# Patient Record
Sex: Female | Born: 1945
Health system: Southern US, Community
[De-identification: ages and names within clinical notes are randomized; demographics above are authoritative.]

## PROBLEM LIST (undated history)

## (undated) HISTORY — PX: BREAST BIOPSY: SHX20

---

## 2000-10-28 ENCOUNTER — Other Ambulatory Visit: Admission: RE | Admit: 2000-10-28 | Discharge: 2000-10-28 | Payer: Self-pay | Admitting: Gynecology

## 2001-12-01 ENCOUNTER — Other Ambulatory Visit: Admission: RE | Admit: 2001-12-01 | Discharge: 2001-12-01 | Payer: Self-pay | Admitting: Gynecology

## 2002-01-22 ENCOUNTER — Encounter: Payer: Self-pay | Admitting: Gynecology

## 2002-01-22 ENCOUNTER — Encounter: Admission: RE | Admit: 2002-01-22 | Discharge: 2002-01-22 | Payer: Self-pay | Admitting: Gynecology

## 2002-10-28 ENCOUNTER — Encounter: Admission: RE | Admit: 2002-10-28 | Discharge: 2002-10-28 | Payer: Self-pay | Admitting: Gynecology

## 2002-10-28 ENCOUNTER — Encounter: Payer: Self-pay | Admitting: Gynecology

## 2002-11-10 ENCOUNTER — Encounter: Admission: RE | Admit: 2002-11-10 | Discharge: 2002-11-10 | Payer: Self-pay | Admitting: Gynecology

## 2002-11-10 ENCOUNTER — Encounter: Payer: Self-pay | Admitting: Gynecology

## 2002-12-07 ENCOUNTER — Other Ambulatory Visit: Admission: RE | Admit: 2002-12-07 | Discharge: 2002-12-07 | Payer: Self-pay | Admitting: Gynecology

## 2003-11-29 ENCOUNTER — Encounter: Admission: RE | Admit: 2003-11-29 | Discharge: 2003-11-29 | Payer: Self-pay | Admitting: Gynecology

## 2003-12-08 ENCOUNTER — Other Ambulatory Visit: Admission: RE | Admit: 2003-12-08 | Discharge: 2003-12-08 | Payer: Self-pay | Admitting: Gynecology

## 2004-11-29 ENCOUNTER — Encounter: Admission: RE | Admit: 2004-11-29 | Discharge: 2004-11-29 | Payer: Self-pay | Admitting: Gynecology

## 2004-12-11 ENCOUNTER — Other Ambulatory Visit: Admission: RE | Admit: 2004-12-11 | Discharge: 2004-12-11 | Payer: Self-pay | Admitting: Gynecology

## 2005-12-03 ENCOUNTER — Encounter: Admission: RE | Admit: 2005-12-03 | Discharge: 2005-12-03 | Payer: Self-pay | Admitting: Gynecology

## 2005-12-13 ENCOUNTER — Other Ambulatory Visit: Admission: RE | Admit: 2005-12-13 | Discharge: 2005-12-13 | Payer: Self-pay | Admitting: Gynecology

## 2006-12-31 ENCOUNTER — Encounter: Admission: RE | Admit: 2006-12-31 | Discharge: 2006-12-31 | Payer: Self-pay | Admitting: Occupational Medicine

## 2006-12-31 ENCOUNTER — Encounter: Admission: RE | Admit: 2006-12-31 | Discharge: 2006-12-31 | Payer: Self-pay | Admitting: Gynecology

## 2007-01-02 ENCOUNTER — Other Ambulatory Visit: Admission: RE | Admit: 2007-01-02 | Discharge: 2007-01-02 | Payer: Self-pay | Admitting: Gynecology

## 2007-01-23 ENCOUNTER — Encounter: Admission: RE | Admit: 2007-01-23 | Discharge: 2007-04-23 | Payer: Self-pay | Admitting: Internal Medicine

## 2007-10-03 ENCOUNTER — Emergency Department (HOSPITAL_COMMUNITY): Admission: EM | Admit: 2007-10-03 | Discharge: 2007-10-03 | Payer: Self-pay | Admitting: Emergency Medicine

## 2008-01-01 ENCOUNTER — Encounter: Admission: RE | Admit: 2008-01-01 | Discharge: 2008-01-01 | Payer: Self-pay | Admitting: Gynecology

## 2009-01-10 ENCOUNTER — Encounter: Admission: RE | Admit: 2009-01-10 | Discharge: 2009-01-10 | Payer: Self-pay | Admitting: Gynecology

## 2010-01-11 ENCOUNTER — Encounter: Admission: RE | Admit: 2010-01-11 | Discharge: 2010-01-11 | Payer: Self-pay | Admitting: Gynecology

## 2010-05-21 ENCOUNTER — Encounter: Payer: Self-pay | Admitting: Gynecology

## 2010-06-26 ENCOUNTER — Inpatient Hospital Stay (INDEPENDENT_AMBULATORY_CARE_PROVIDER_SITE_OTHER)
Admission: RE | Admit: 2010-06-26 | Discharge: 2010-06-26 | Disposition: A | Discharge status: Home / Self Care | Payer: Self-pay | Source: Ambulatory Visit | Attending: Emergency Medicine | Admitting: Emergency Medicine

## 2010-06-26 ENCOUNTER — Ambulatory Visit (INDEPENDENT_AMBULATORY_CARE_PROVIDER_SITE_OTHER): Payer: PRIVATE HEALTH INSURANCE

## 2010-06-26 ENCOUNTER — Ambulatory Visit (HOSPITAL_COMMUNITY): Admission: RE | Admit: 2010-06-26 | Payer: Self-pay | Source: Ambulatory Visit

## 2010-06-26 DIAGNOSIS — S8000XA Contusion of unspecified knee, initial encounter: Secondary | ICD-10-CM

## 2010-12-05 ENCOUNTER — Other Ambulatory Visit: Payer: Self-pay | Admitting: Gynecology

## 2010-12-05 DIAGNOSIS — Z1231 Encounter for screening mammogram for malignant neoplasm of breast: Secondary | ICD-10-CM

## 2011-01-15 ENCOUNTER — Ambulatory Visit: Payer: Self-pay

## 2011-01-15 ENCOUNTER — Ambulatory Visit
Admission: RE | Admit: 2011-01-15 | Discharge: 2011-01-15 | Disposition: A | Discharge status: Home / Self Care | Payer: PRIVATE HEALTH INSURANCE | Source: Ambulatory Visit | Attending: Gynecology | Admitting: Gynecology

## 2011-01-15 DIAGNOSIS — Z1231 Encounter for screening mammogram for malignant neoplasm of breast: Secondary | ICD-10-CM

## 2011-01-19 ENCOUNTER — Other Ambulatory Visit: Payer: Self-pay | Admitting: Gynecology

## 2011-01-19 DIAGNOSIS — R928 Other abnormal and inconclusive findings on diagnostic imaging of breast: Secondary | ICD-10-CM

## 2011-01-31 ENCOUNTER — Other Ambulatory Visit: Payer: Self-pay | Admitting: Gynecology

## 2011-01-31 ENCOUNTER — Ambulatory Visit
Admission: RE | Admit: 2011-01-31 | Discharge: 2011-01-31 | Disposition: A | Discharge status: Home / Self Care | Payer: 59 | Source: Ambulatory Visit | Attending: Gynecology | Admitting: Gynecology

## 2011-01-31 DIAGNOSIS — R928 Other abnormal and inconclusive findings on diagnostic imaging of breast: Secondary | ICD-10-CM

## 2011-12-10 ENCOUNTER — Other Ambulatory Visit: Payer: Self-pay | Admitting: Gynecology

## 2011-12-10 DIAGNOSIS — Z1231 Encounter for screening mammogram for malignant neoplasm of breast: Secondary | ICD-10-CM

## 2012-01-16 ENCOUNTER — Ambulatory Visit
Admission: RE | Admit: 2012-01-16 | Discharge: 2012-01-16 | Disposition: A | Discharge status: Home / Self Care | Payer: 59 | Source: Ambulatory Visit | Attending: Gynecology | Admitting: Gynecology

## 2012-01-16 DIAGNOSIS — Z1231 Encounter for screening mammogram for malignant neoplasm of breast: Secondary | ICD-10-CM

## 2012-01-28 ENCOUNTER — Other Ambulatory Visit: Payer: Self-pay | Admitting: Gynecology

## 2012-12-08 ENCOUNTER — Other Ambulatory Visit: Payer: Self-pay

## 2012-12-08 DIAGNOSIS — Z1231 Encounter for screening mammogram for malignant neoplasm of breast: Secondary | ICD-10-CM

## 2013-01-19 ENCOUNTER — Ambulatory Visit
Admission: RE | Admit: 2013-01-19 | Discharge: 2013-01-19 | Disposition: A | Discharge status: Home / Self Care | Payer: 59 | Source: Ambulatory Visit

## 2013-01-19 DIAGNOSIS — Z1231 Encounter for screening mammogram for malignant neoplasm of breast: Secondary | ICD-10-CM

## 2013-02-06 ENCOUNTER — Other Ambulatory Visit: Payer: Self-pay | Admitting: Gynecology

## 2013-02-06 DIAGNOSIS — M810 Age-related osteoporosis without current pathological fracture: Secondary | ICD-10-CM

## 2013-12-11 ENCOUNTER — Other Ambulatory Visit: Payer: Self-pay

## 2013-12-11 DIAGNOSIS — Z1231 Encounter for screening mammogram for malignant neoplasm of breast: Secondary | ICD-10-CM

## 2014-01-20 ENCOUNTER — Ambulatory Visit
Admission: RE | Admit: 2014-01-20 | Discharge: 2014-01-20 | Disposition: A | Discharge status: Home / Self Care | Payer: 59 | Source: Ambulatory Visit

## 2014-01-20 ENCOUNTER — Encounter (INDEPENDENT_AMBULATORY_CARE_PROVIDER_SITE_OTHER): Payer: Self-pay

## 2014-01-20 DIAGNOSIS — Z1231 Encounter for screening mammogram for malignant neoplasm of breast: Secondary | ICD-10-CM

## 2014-12-28 ENCOUNTER — Other Ambulatory Visit: Payer: Self-pay

## 2014-12-28 DIAGNOSIS — Z1231 Encounter for screening mammogram for malignant neoplasm of breast: Secondary | ICD-10-CM

## 2015-01-24 ENCOUNTER — Ambulatory Visit
Admission: RE | Admit: 2015-01-24 | Discharge: 2015-01-24 | Disposition: A | Discharge status: Home / Self Care | Payer: 59 | Source: Ambulatory Visit

## 2015-01-24 DIAGNOSIS — Z1231 Encounter for screening mammogram for malignant neoplasm of breast: Secondary | ICD-10-CM

## 2015-06-10 DIAGNOSIS — D649 Anemia, unspecified: Secondary | ICD-10-CM | POA: Diagnosis not present

## 2015-07-08 DIAGNOSIS — M79672 Pain in left foot: Secondary | ICD-10-CM | POA: Diagnosis not present

## 2015-08-26 MED FILL — ESTRADIOL TDS 0.0375 MG/DAY: 0.0375 | 84 days supply | Qty: 12 | Fill #1

## 2015-09-06 DIAGNOSIS — M79672 Pain in left foot: Secondary | ICD-10-CM | POA: Diagnosis not present

## 2015-09-27 DIAGNOSIS — M722 Plantar fascial fibromatosis: Secondary | ICD-10-CM | POA: Diagnosis not present

## 2015-11-08 DIAGNOSIS — M722 Plantar fascial fibromatosis: Secondary | ICD-10-CM | POA: Diagnosis not present

## 2015-12-19 DIAGNOSIS — M722 Plantar fascial fibromatosis: Secondary | ICD-10-CM | POA: Diagnosis not present

## 2015-12-28 ENCOUNTER — Other Ambulatory Visit: Payer: Self-pay | Admitting: Pharmacy Technician

## 2015-12-28 ENCOUNTER — Other Ambulatory Visit: Payer: Self-pay | Admitting: Gynecology

## 2015-12-28 DIAGNOSIS — Z1231 Encounter for screening mammogram for malignant neoplasm of breast: Secondary | ICD-10-CM

## 2016-01-11 MED FILL — ESTRADIOL TDS 0.0375 MG/DAY: 0.0375 | 84 days supply | Qty: 12 | Fill #2

## 2016-01-26 ENCOUNTER — Ambulatory Visit
Admission: RE | Admit: 2016-01-26 | Discharge: 2016-01-26 | Disposition: A | Discharge status: Home / Self Care | Payer: 59 | Source: Ambulatory Visit | Attending: Gynecology | Admitting: Gynecology

## 2016-01-26 DIAGNOSIS — Z1231 Encounter for screening mammogram for malignant neoplasm of breast: Secondary | ICD-10-CM | POA: Diagnosis not present

## 2016-01-30 DIAGNOSIS — M722 Plantar fascial fibromatosis: Secondary | ICD-10-CM | POA: Diagnosis not present

## 2016-01-30 DIAGNOSIS — M25572 Pain in left ankle and joints of left foot: Secondary | ICD-10-CM | POA: Diagnosis not present

## 2016-04-25 DIAGNOSIS — Z01419 Encounter for gynecological examination (general) (routine) without abnormal findings: Secondary | ICD-10-CM | POA: Diagnosis not present

## 2016-04-25 DIAGNOSIS — Z780 Asymptomatic menopausal state: Secondary | ICD-10-CM | POA: Diagnosis not present

## 2016-04-25 DIAGNOSIS — Z1389 Encounter for screening for other disorder: Secondary | ICD-10-CM | POA: Diagnosis not present

## 2016-04-25 DIAGNOSIS — Z6822 Body mass index (BMI) 22.0-22.9, adult: Secondary | ICD-10-CM | POA: Diagnosis not present

## 2016-04-25 DIAGNOSIS — R319 Hematuria, unspecified: Secondary | ICD-10-CM | POA: Diagnosis not present

## 2016-04-25 DIAGNOSIS — Z130 Encounter for screening for diseases of the blood and blood-forming organs and certain disorders involving the immune mechanism: Secondary | ICD-10-CM | POA: Diagnosis not present

## 2016-04-25 DIAGNOSIS — Z79890 Hormone replacement therapy (postmenopausal): Secondary | ICD-10-CM | POA: Diagnosis not present

## 2016-04-25 MED FILL — ESTRADIOL TDS 0.0375 MG/DAY: 0.0375 | 84 days supply | Qty: 12 | Fill #0 | Status: TO

## 2016-05-23 DIAGNOSIS — R319 Hematuria, unspecified: Secondary | ICD-10-CM | POA: Diagnosis not present

## 2016-12-14 ENCOUNTER — Other Ambulatory Visit: Payer: Self-pay | Admitting: Gynecology

## 2016-12-14 DIAGNOSIS — Z1231 Encounter for screening mammogram for malignant neoplasm of breast: Secondary | ICD-10-CM

## 2017-01-28 ENCOUNTER — Ambulatory Visit
Admission: RE | Admit: 2017-01-28 | Discharge: 2017-01-28 | Disposition: A | Discharge status: Home / Self Care | Payer: Medicare Other | Source: Ambulatory Visit | Attending: Gynecology | Admitting: Gynecology

## 2017-01-28 DIAGNOSIS — Z1231 Encounter for screening mammogram for malignant neoplasm of breast: Secondary | ICD-10-CM

## 2017-12-31 ENCOUNTER — Other Ambulatory Visit: Payer: Self-pay | Admitting: Gynecology

## 2017-12-31 DIAGNOSIS — Z1231 Encounter for screening mammogram for malignant neoplasm of breast: Secondary | ICD-10-CM

## 2018-02-03 ENCOUNTER — Ambulatory Visit: Payer: Self-pay

## 2018-02-03 ENCOUNTER — Ambulatory Visit
Admission: RE | Admit: 2018-02-03 | Discharge: 2018-02-03 | Disposition: A | Discharge status: Home / Self Care | Payer: Medicare Other | Source: Ambulatory Visit | Attending: Gynecology | Admitting: Gynecology

## 2018-02-03 DIAGNOSIS — Z1231 Encounter for screening mammogram for malignant neoplasm of breast: Secondary | ICD-10-CM

## 2018-02-04 ENCOUNTER — Other Ambulatory Visit: Payer: Self-pay | Admitting: Gynecology

## 2018-02-04 DIAGNOSIS — R928 Other abnormal and inconclusive findings on diagnostic imaging of breast: Secondary | ICD-10-CM

## 2018-02-05 ENCOUNTER — Other Ambulatory Visit: Payer: Self-pay | Admitting: Gynecology

## 2018-02-05 ENCOUNTER — Other Ambulatory Visit: Payer: Self-pay

## 2018-02-05 DIAGNOSIS — R928 Other abnormal and inconclusive findings on diagnostic imaging of breast: Secondary | ICD-10-CM

## 2018-02-06 ENCOUNTER — Ambulatory Visit
Admission: RE | Admit: 2018-02-06 | Discharge: 2018-02-06 | Disposition: A | Discharge status: Home / Self Care | Payer: Medicare Other | Source: Ambulatory Visit | Attending: Gynecology | Admitting: Gynecology

## 2018-02-06 DIAGNOSIS — R928 Other abnormal and inconclusive findings on diagnostic imaging of breast: Secondary | ICD-10-CM

## 2018-07-21 ENCOUNTER — Other Ambulatory Visit: Payer: Self-pay

## 2018-07-21 ENCOUNTER — Encounter (HOSPITAL_COMMUNITY): Payer: Self-pay

## 2018-07-21 ENCOUNTER — Emergency Department (HOSPITAL_COMMUNITY)
Admission: EM | Admit: 2018-07-21 | Discharge: 2018-07-21 | Disposition: A | Discharge status: Home / Self Care | Payer: Medicare Other | Attending: Emergency Medicine | Admitting: Emergency Medicine

## 2018-07-21 DIAGNOSIS — R040 Epistaxis: Secondary | ICD-10-CM

## 2018-07-21 MED ORDER — DOXYCYCLINE HYCLATE 100 MG PO CAPS: 100.0000 mg | ORAL_CAPSULE | Freq: Two times a day (BID) | ORAL | 0 refills | Status: AC

## 2018-07-21 MED ORDER — OXYMETAZOLINE HCL 0.05 % NA SOLN
1.0000 | Freq: Once | NASAL | Status: AC
  Administered 2018-07-21: 1 via NASAL
  Filled 2018-07-21: qty 30

## 2018-07-21 NOTE — ED Triage Notes (Signed)
Pt states that she has been having nosebleeds on and off. No bleeding currently. Pt states she woke up this morning to one, and spit out 2 large blood clots. Pt also c/o headache.

## 2018-07-21 NOTE — Discharge Instructions (Addendum)
Take out the packing in 48 hours.  Return here for any recurrent bleeding

## 2018-07-21 NOTE — ED Provider Notes (Signed)
[facility] DEPT Provider Note   CSN: [id number] Arrival date & time: 07/21/18  5400    History   Chief Complaint Chief Complaint  Patient presents with  . Epistaxis    HPI CANIYAH MURLEY is a 73 y.o. female.     73 year old female presents with intermittent right-sided epistaxis x1 day.  Bleeding is going to the back of her throat and is now controlled.  Denies any blood thinner use.  History of similar symptoms which required cautery.  Has been using nasal saline drops.  No bleeding currently     History reviewed. No pertinent past medical history.  There are no active problems to display for this patient.   Past Surgical History:  Procedure Laterality Date  . BREAST BIOPSY       OB History   No obstetric history on file.      Home Medications    Prior to Admission medications   Not on File    Family History No family history on file.  Social History Social History   Tobacco Use  . Smoking status: Never Smoker  . Smokeless tobacco: Never Used  Substance Use Topics  . Alcohol use: Never    Frequency: Never  . Drug use: Never     Allergies   Patient has no known allergies.   Review of Systems Review of Systems  All other systems reviewed and are negative.    Physical Exam Updated Vital Signs BP (!) 157/71 (BP Location: Left Arm)   Pulse 72   Temp (!) 97.5 F (36.4 C) (Oral)   Resp 18   SpO2 100%   Physical Exam Vitals signs and nursing note reviewed.  Constitutional:      General: She is not in acute distress.    Appearance: Normal appearance. She is well-developed. She is not toxic-appearing.  HENT:     Head: Normocephalic and atraumatic.     Nose:     Right Nostril: Epistaxis present. No foreign body.  Eyes:     General: Lids are normal.     Conjunctiva/sclera: Conjunctivae normal.     Pupils: Pupils are equal, round, and reactive to light.  Neck:     Musculoskeletal: Normal range of motion  and neck supple.     Thyroid: No thyroid mass.     Trachea: No tracheal deviation.  Cardiovascular:     Rate and Rhythm: Normal rate and regular rhythm.     Heart sounds: Normal heart sounds. No murmur. No gallop.   Pulmonary:     Effort: Pulmonary effort is normal. No respiratory distress.     Breath sounds: Normal breath sounds. No stridor. No decreased breath sounds, wheezing, rhonchi or rales.  Abdominal:     General: Bowel sounds are normal. There is no distension.     Palpations: Abdomen is soft.     Tenderness: There is no abdominal tenderness. There is no rebound.  Musculoskeletal: Normal range of motion.        General: No tenderness.  Skin:    General: Skin is warm and dry.     Findings: No abrasion or rash.  Neurological:     Mental Status: She is alert and oriented to person, place, and time.     GCS: GCS eye subscore is 4. GCS verbal subscore is 5. GCS motor subscore is 6.     Cranial Nerves: No cranial nerve deficit.     Sensory: No sensory deficit.  Psychiatric:  Speech: Speech normal.        Behavior: Behavior normal.      ED Treatments / Results  Labs (all labs ordered are listed, but only abnormal results are displayed) Labs Reviewed - No data to display  EKG None  Radiology No results found.  Procedures .Epistaxis Management Date/Time: 07/21/2018 9:58 AM Performed by: Lacretia Leigh, MD Authorized by: Lacretia Leigh, MD   Consent:    Consent obtained:  Verbal   Consent given by:  Patient   Risks discussed:  Bleeding   Alternatives discussed:  No treatment Anesthesia (see MAR for exact dosages):    Anesthesia method:  None Procedure details:    Treatment site:  R posterior   Treatment method:  Nasal tampon   Treatment complexity:  Limited   Treatment episode: recurring   Post-procedure details:    Assessment:  Bleeding stopped   Patient tolerance of procedure:  Tolerated well, no immediate complications   (including critical care  time)  Medications Ordered in ED Medications  oxymetazoline (AFRIN) 0.05 % nasal spray 1 spray (1 spray Each Nare Given 07/21/18 0950)     Initial Impression / Assessment and Plan / ED Course  I have reviewed the triage vital signs and the nursing notes.  Pertinent labs & imaging results that were available during my care of the patient were reviewed by me and considered in my medical decision making (see chart for details).       Patient to have packing removed in 48 hours.  Will place on antibiotics.  Final Clinical Impressions(s) / ED Diagnoses   Final diagnoses:  None    ED Discharge Orders    None       Lacretia Leigh, MD 07/21/18 [phone]

## 2019-01-06 ENCOUNTER — Other Ambulatory Visit: Payer: Self-pay | Admitting: Gynecology

## 2019-01-06 DIAGNOSIS — Z1231 Encounter for screening mammogram for malignant neoplasm of breast: Secondary | ICD-10-CM

## 2019-02-23 ENCOUNTER — Other Ambulatory Visit: Payer: Self-pay

## 2019-02-23 ENCOUNTER — Ambulatory Visit
Admission: RE | Admit: 2019-02-23 | Discharge: 2019-02-23 | Disposition: A | Discharge status: Home / Self Care | Payer: Medicare Other | Source: Ambulatory Visit | Attending: Gynecology | Admitting: Gynecology

## 2019-02-23 DIAGNOSIS — Z1231 Encounter for screening mammogram for malignant neoplasm of breast: Secondary | ICD-10-CM

## 2020-01-25 ENCOUNTER — Other Ambulatory Visit: Payer: Self-pay | Admitting: Gynecology

## 2020-01-25 DIAGNOSIS — Z1231 Encounter for screening mammogram for malignant neoplasm of breast: Secondary | ICD-10-CM

## 2020-02-10 ENCOUNTER — Ambulatory Visit: Payer: Medicare Other

## 2020-02-24 ENCOUNTER — Other Ambulatory Visit: Payer: Self-pay

## 2020-02-24 ENCOUNTER — Ambulatory Visit
Admission: RE | Admit: 2020-02-24 | Discharge: 2020-02-24 | Disposition: A | Discharge status: Home / Self Care | Payer: Medicare Other | Source: Ambulatory Visit | Attending: Gynecology | Admitting: Gynecology

## 2020-02-24 DIAGNOSIS — Z1231 Encounter for screening mammogram for malignant neoplasm of breast: Secondary | ICD-10-CM

## 2020-08-26 ENCOUNTER — Other Ambulatory Visit: Payer: Self-pay | Admitting: Gynecology

## 2020-08-26 DIAGNOSIS — Z13820 Encounter for screening for osteoporosis: Secondary | ICD-10-CM

## 2021-01-17 ENCOUNTER — Other Ambulatory Visit: Payer: Self-pay | Admitting: Family Medicine

## 2021-01-17 ENCOUNTER — Ambulatory Visit
Admission: RE | Admit: 2021-01-17 | Discharge: 2021-01-17 | Disposition: A | Discharge status: Home / Self Care | Payer: Medicare Other | Source: Ambulatory Visit | Attending: Family Medicine | Admitting: Family Medicine

## 2021-01-17 DIAGNOSIS — M545 Low back pain, unspecified: Secondary | ICD-10-CM

## 2021-02-05 IMAGING — MG DIGITAL SCREENING BILAT W/ TOMO W/ CAD
6 of 10 series · 6 of 30 positions shown · non-contrast
Comparison: Previous exam(s).

CLINICAL DATA: Screening.

EXAM:
DIGITAL SCREENING BILATERAL MAMMOGRAM WITH TOMO AND CAD

[L CC synth-2D (1 of 2)]
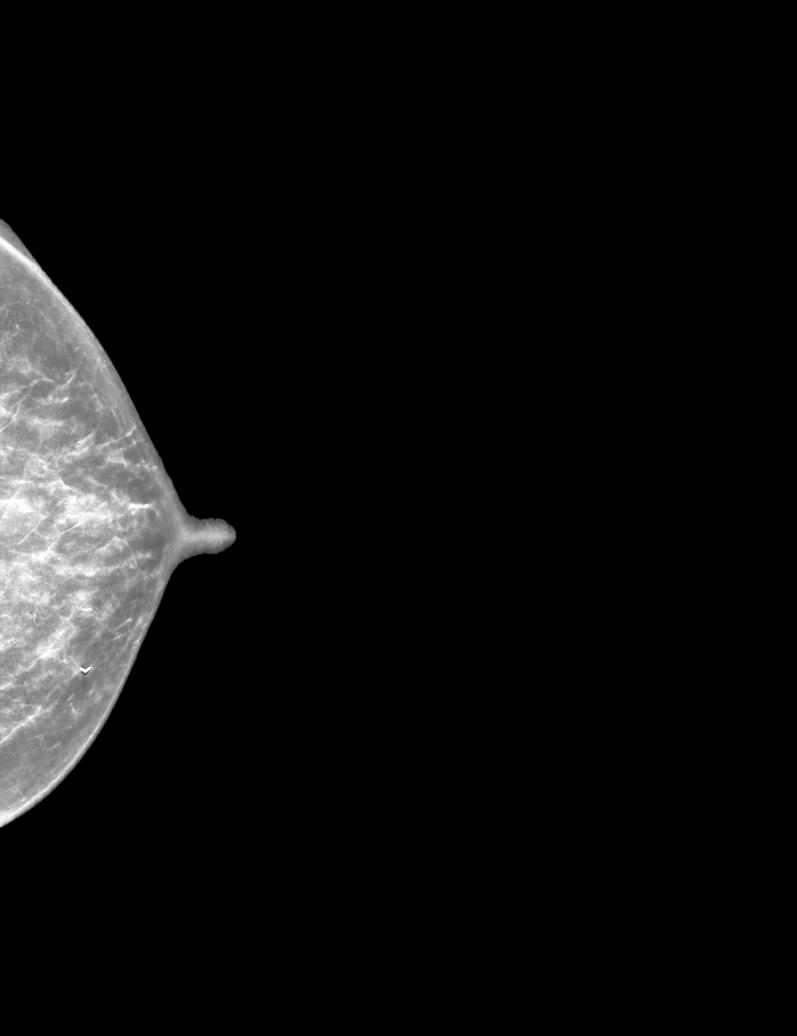

[R MLO synth-2D]
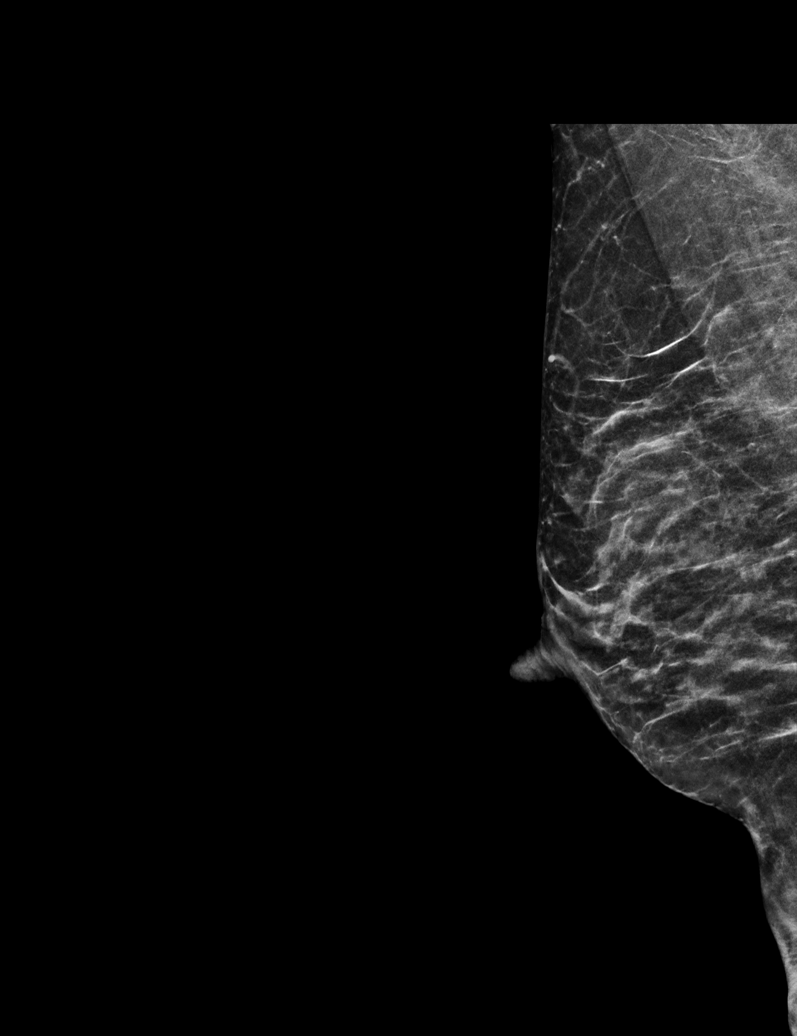

[R CC synth-2D]
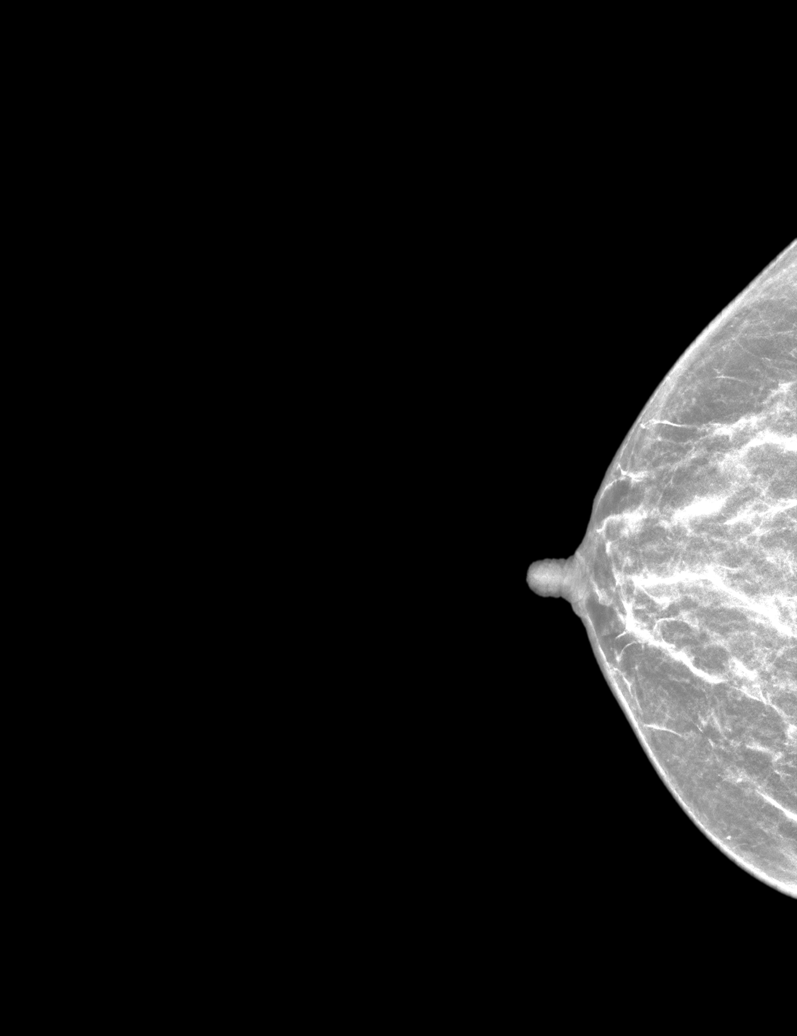

[L MLO synth-2D]
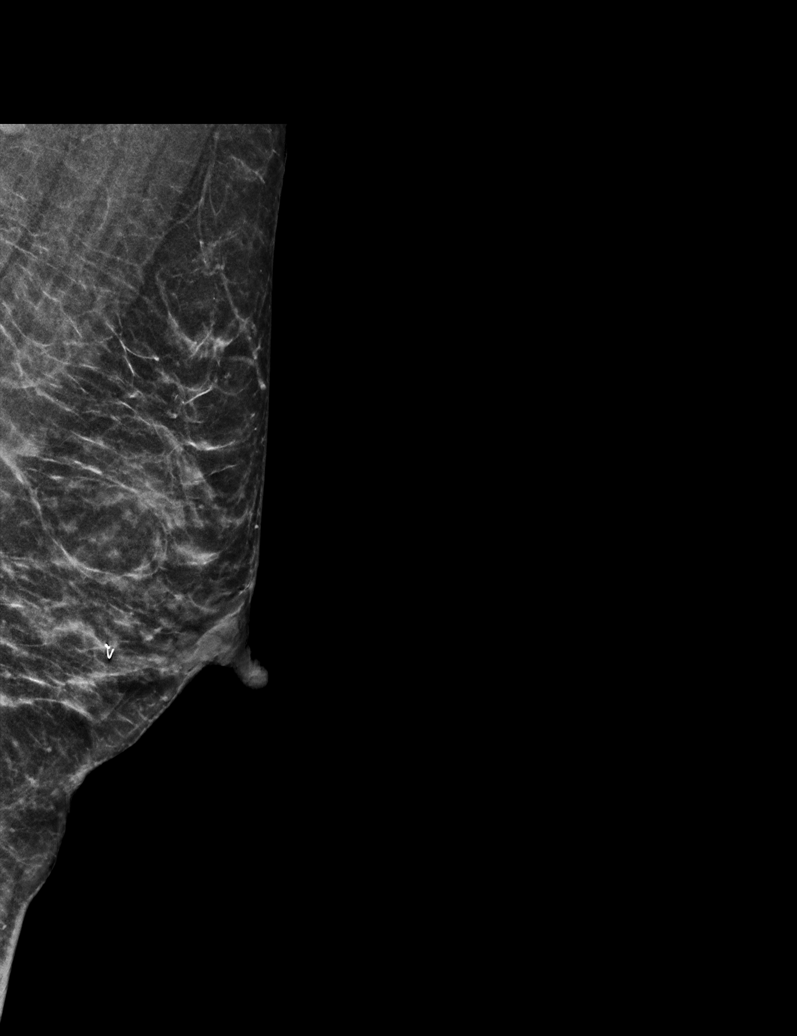

[L CC synth-2D (2 of 2)]
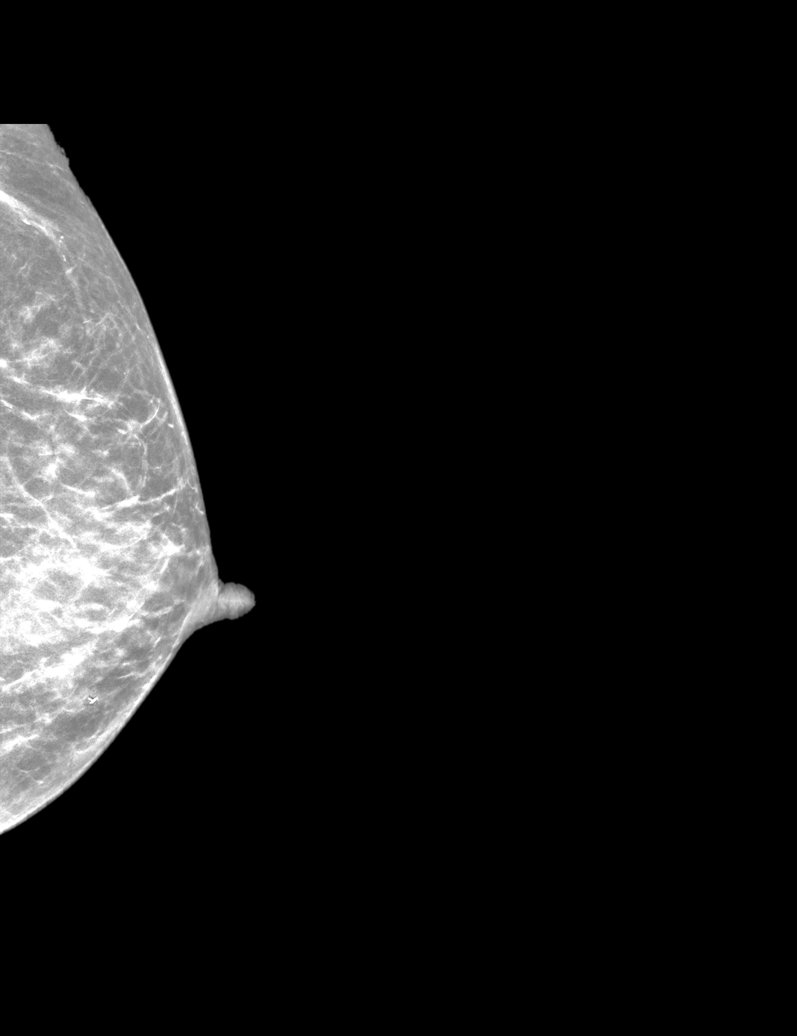

[L MLO tomo · tomo slice 23/44.0]
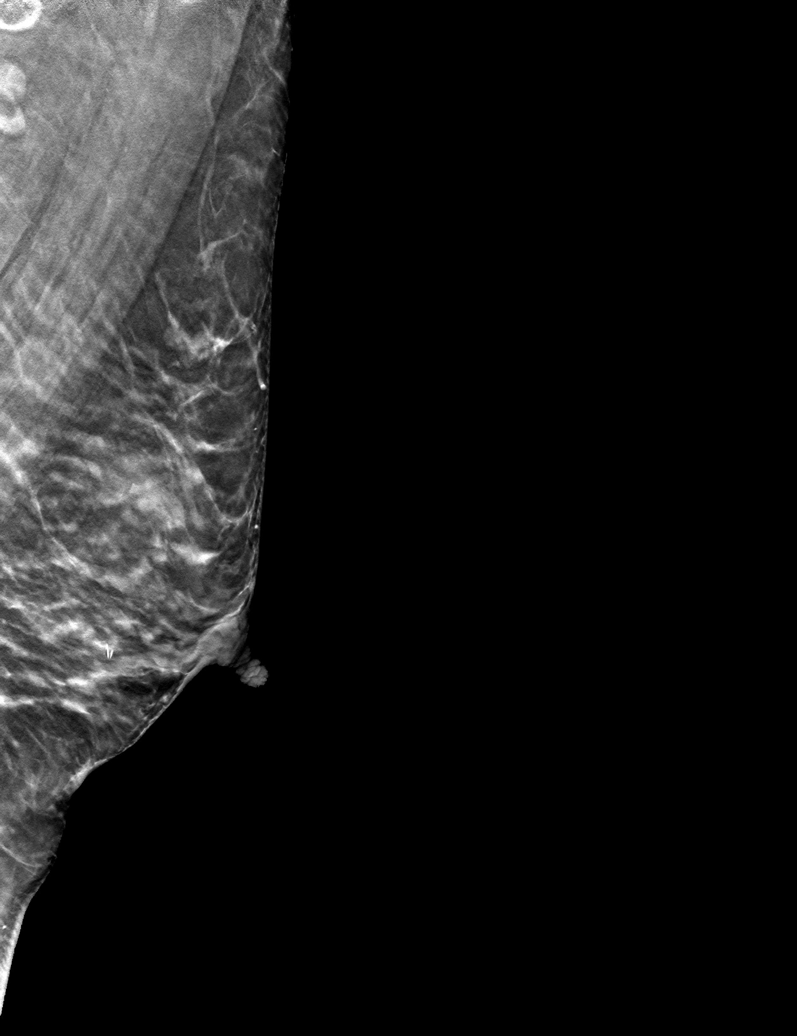

[6 of 30 positions shown; findings below may reference images not displayed]

ACR Breast Density Category c: The breast tissue is heterogeneously
dense, which may obscure small masses.
FINDINGS: There are no findings suspicious for malignancy. Images were
processed with CAD.
IMPRESSION: No mammographic evidence of malignancy. A result letter of this
screening mammogram will be mailed directly to the patient.

RECOMMENDATION:
Screening mammogram in one year. (Code:[id number])

BI-RADS CATEGORY  1: Negative.

## 2021-03-31 ENCOUNTER — Other Ambulatory Visit: Payer: Medicare Other

## 2021-10-02 DIAGNOSIS — L821 Other seborrheic keratosis: Secondary | ICD-10-CM | POA: Diagnosis not present

## 2021-10-02 DIAGNOSIS — D1801 Hemangioma of skin and subcutaneous tissue: Secondary | ICD-10-CM | POA: Diagnosis not present

## 2021-12-17 ENCOUNTER — Other Ambulatory Visit: Payer: Self-pay

## 2021-12-17 ENCOUNTER — Emergency Department (HOSPITAL_COMMUNITY)
Admission: EM | Admit: 2021-12-17 | Discharge: 2021-12-17 | Disposition: A | Discharge status: Home / Self Care | Payer: Medicare Other | Attending: Emergency Medicine | Admitting: Emergency Medicine

## 2021-12-17 ENCOUNTER — Encounter (HOSPITAL_COMMUNITY): Payer: Self-pay

## 2021-12-17 DIAGNOSIS — R040 Epistaxis: Secondary | ICD-10-CM | POA: Insufficient documentation

## 2021-12-17 MED ORDER — OXYMETAZOLINE HCL 0.05 % NA SOLN
1.0000 | Freq: Once | NASAL | Status: AC
  Administered 2021-12-17: 1 via NASAL
  Filled 2021-12-17: qty 30

## 2021-12-17 NOTE — Discharge Instructions (Addendum)
You have been evaluated for your nosebleed.  Fortunately your nosebleed has stopped spontaneously.  If the bleeding started again, please blow your nose, then spray Afrin spray inside your nose and then hold steady pressure for at least 10 to 15 minutes.  If bleeding persist, return to the ER for further management.

## 2021-12-17 NOTE — ED Provider Notes (Signed)
[facility] DEPT Provider Note   CSN: [id number] Arrival date & time: 12/17/21  1617     History  Chief Complaint  Patient presents with   Epistaxis    Anna Jones is a 76 y.o. female.  The history is provided by the patient and medical records. No language interpreter was used.  Epistaxis    76 year old female presenting with complaints of nosebleed.  She developed bleeding from the right side of her nose a few hours ago while she was cooking.  She states bleeding was persistent prompting this ER visit.  Now that she is in the ER, her bleeding is stopped.  She is not on any blood thinner medication.  She does not endorse any significant pain no recent injury.  Denies any other abnormal bleeding.  States she has had nosebleed 2 years ago requiring treatment.  Home Medications Prior to Admission medications   Medication Sig Start Date End Date Taking? Authorizing Provider  doxycycline (VIBRAMYCIN) 100 MG capsule Take 1 capsule (100 mg total) by mouth 2 (two) times daily. 07/21/18   Lacretia Leigh, MD      Allergies    Patient has no known allergies.    Review of Systems   Review of Systems  HENT:  Positive for nosebleeds.   All other systems reviewed and are negative.   Physical Exam Updated Vital Signs BP (!) 153/83 (BP Location: Right Arm)   Pulse 78   Temp 98.2 F (36.8 C) (Oral)   Resp 16   Ht '4\' 11"'$  (1.499 m)   Wt 52.2 kg   SpO2 100%   BMI 23.23 kg/m  Physical Exam Vitals and nursing note reviewed.  Constitutional:      General: She is not in acute distress.    Appearance: She is well-developed.  HENT:     Head: Atraumatic.     Nose: Nose normal. No congestion or rhinorrhea.  Eyes:     Conjunctiva/sclera: Conjunctivae normal.  Pulmonary:     Effort: Pulmonary effort is normal.  Musculoskeletal:     Cervical back: Neck supple.  Skin:    Findings: No rash.  Neurological:     Mental Status: She is alert.   Psychiatric:        Mood and Affect: Mood normal.     ED Results / Procedures / Treatments   Labs (all labs ordered are listed, but only abnormal results are displayed) Labs Reviewed - No data to display  EKG None  Radiology No results found.  Procedures Procedures    Medications Ordered in ED Medications  oxymetazoline (AFRIN) 0.05 % nasal spray 1 spray (has no administration in time range)    ED Course/ Medical Decision Making/ A&P                           Medical Decision Making  BP (!) 153/83 (BP Location: Right Arm)   Pulse 78   Temp 98.2 F (36.8 C) (Oral)   Resp 16   Ht '4\' 11"'$  (1.499 m)   Wt 52.2 kg   SpO2 100%   BMI 23.23 kg/m   5:40 PM This is a 76 year old female presenting complaining of nosebleed.  She endorsed bleeding coming from her right nares that started a few hours ago while she was cooking.  Initial bleeding was persistent.  She tries to apply ice and hold pressure without adequate relief.  Once she came to the ER her  bleeding has ceased.  She denies any recent injury.  She denies any headache.  She is not on any blood thinner medication.  She reported having 1 similar episode 2 years ago when she was seen and had to have a tampon placed in her nose.  On exam patient is resting comfortably appears to be in no acute discomfort.  Vital signs remarkable for blood pressure of 153/83.  Patient is afebrile.  Nose exam without any obvious active nosebleed or signs of trauma.  Throat exam unremarkable.  No palpable pain.  I will provide patient with Afrin spray to use as needed with appropriate instruction but patient otherwise stable for discharge.        Final Clinical Impression(s) / ED Diagnoses Final diagnoses:  Epistaxis    Rx / DC Orders ED Discharge Orders     None         Domenic Moras, PA-C 12/17/21 1752    Drenda Freeze, MD 12/17/21 2240

## 2021-12-17 NOTE — ED Triage Notes (Signed)
Pt reports nose bleed around 1300 today. Bleeding has stopped. Reports some sinus congestion.

## 2021-12-18 DIAGNOSIS — Z87898 Personal history of other specified conditions: Secondary | ICD-10-CM | POA: Diagnosis not present

## 2021-12-19 DIAGNOSIS — R040 Epistaxis: Secondary | ICD-10-CM | POA: Diagnosis not present

## 2021-12-19 DIAGNOSIS — Z6821 Body mass index (BMI) 21.0-21.9, adult: Secondary | ICD-10-CM | POA: Diagnosis not present

## 2022-09-04 DIAGNOSIS — Z780 Asymptomatic menopausal state: Secondary | ICD-10-CM | POA: Diagnosis not present

## 2022-09-04 DIAGNOSIS — R61 Generalized hyperhidrosis: Secondary | ICD-10-CM | POA: Diagnosis not present
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
## 2023-03-05 DIAGNOSIS — E785 Hyperlipidemia, unspecified: Secondary | ICD-10-CM | POA: Diagnosis not present

## 2023-03-05 DIAGNOSIS — R21 Rash and other nonspecific skin eruption: Secondary | ICD-10-CM | POA: Diagnosis not present

## 2023-09-12 DIAGNOSIS — E782 Mixed hyperlipidemia: Secondary | ICD-10-CM | POA: Diagnosis not present

## 2023-09-12 DIAGNOSIS — R7309 Other abnormal glucose: Secondary | ICD-10-CM | POA: Diagnosis not present

## 2023-09-12 DIAGNOSIS — L659 Nonscarring hair loss, unspecified: Secondary | ICD-10-CM | POA: Diagnosis not present

## 2023-11-21 DIAGNOSIS — L659 Nonscarring hair loss, unspecified: Secondary | ICD-10-CM | POA: Diagnosis not present

## 2023-11-21 DIAGNOSIS — Z0000 Encounter for general adult medical examination without abnormal findings: Secondary | ICD-10-CM | POA: Diagnosis not present

## 2023-11-21 DIAGNOSIS — Z6821 Body mass index (BMI) 21.0-21.9, adult: Secondary | ICD-10-CM | POA: Diagnosis not present

## 2023-11-21 DIAGNOSIS — R7303 Prediabetes: Secondary | ICD-10-CM | POA: Diagnosis not present

## 2023-11-21 DIAGNOSIS — E559 Vitamin D deficiency, unspecified: Secondary | ICD-10-CM | POA: Diagnosis not present

## 2023-11-21 DIAGNOSIS — Z1211 Encounter for screening for malignant neoplasm of colon: Secondary | ICD-10-CM | POA: Diagnosis not present

## 2023-11-21 DIAGNOSIS — Z23 Encounter for immunization: Secondary | ICD-10-CM | POA: Diagnosis not present

## 2023-11-21 DIAGNOSIS — E782 Mixed hyperlipidemia: Secondary | ICD-10-CM | POA: Diagnosis not present

## 2024-01-03 DIAGNOSIS — B029 Zoster without complications: Secondary | ICD-10-CM | POA: Diagnosis not present
